# Patient Record
Sex: Male | Born: 1970
Health system: Southern US, Community
[De-identification: ages and names within clinical notes are randomized; demographics above are authoritative.]

---

## 2015-09-08 ENCOUNTER — Emergency Department (HOSPITAL_BASED_OUTPATIENT_CLINIC_OR_DEPARTMENT_OTHER)
Admission: EM | Admit: 2015-09-08 | Discharge: 2015-09-08 | Disposition: A | Discharge status: Home / Self Care | Payer: No Typology Code available for payment source | Attending: Emergency Medicine | Admitting: Emergency Medicine

## 2015-09-08 ENCOUNTER — Emergency Department (HOSPITAL_BASED_OUTPATIENT_CLINIC_OR_DEPARTMENT_OTHER): Payer: No Typology Code available for payment source

## 2015-09-08 ENCOUNTER — Encounter (HOSPITAL_BASED_OUTPATIENT_CLINIC_OR_DEPARTMENT_OTHER): Payer: Self-pay | Admitting: Emergency Medicine

## 2015-09-08 DIAGNOSIS — S199XXA Unspecified injury of neck, initial encounter: Secondary | ICD-10-CM | POA: Diagnosis present

## 2015-09-08 DIAGNOSIS — S161XXA Strain of muscle, fascia and tendon at neck level, initial encounter: Secondary | ICD-10-CM | POA: Diagnosis not present

## 2015-09-08 DIAGNOSIS — S56911A Strain of unspecified muscles, fascia and tendons at forearm level, right arm, initial encounter: Secondary | ICD-10-CM | POA: Insufficient documentation

## 2015-09-08 DIAGNOSIS — Y92410 Unspecified street and highway as the place of occurrence of the external cause: Secondary | ICD-10-CM | POA: Insufficient documentation

## 2015-09-08 DIAGNOSIS — S20319A Abrasion of unspecified front wall of thorax, initial encounter: Secondary | ICD-10-CM | POA: Diagnosis not present

## 2015-09-08 DIAGNOSIS — T1490XA Injury, unspecified, initial encounter: Secondary | ICD-10-CM

## 2015-09-08 DIAGNOSIS — Y999 Unspecified external cause status: Secondary | ICD-10-CM | POA: Diagnosis not present

## 2015-09-08 DIAGNOSIS — Y9389 Activity, other specified: Secondary | ICD-10-CM | POA: Insufficient documentation

## 2015-09-08 MED ORDER — CYCLOBENZAPRINE HCL 10 MG PO TABS: 10.0000 mg | ORAL_TABLET | Freq: Two times a day (BID) | ORAL | 0 refills | Status: AC | PRN

## 2015-09-08 MED FILL — CYCLOBENZAPRINE 10 MG TAB: 10 | 10 days supply | Qty: 20 | Fill #0
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
## 2015-09-08 NOTE — ED Notes (Signed)
Patient transported to X-ray 

## 2015-09-08 NOTE — ED Notes (Signed)
MD at bedside. 
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
## 2015-09-08 NOTE — ED Notes (Signed)
UDS & BAT completed
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
